# Patient Record
Sex: Female | Born: 1935 | Race: White | Hispanic: No | Smoking: Never smoker
Health system: Southern US, Community
[De-identification: ages and names within clinical notes are randomized; demographics above are authoritative.]

## PROBLEM LIST (undated history)

## (undated) DIAGNOSIS — R0602 Shortness of breath: Secondary | ICD-10-CM

## (undated) DIAGNOSIS — R002 Palpitations: Secondary | ICD-10-CM

## (undated) DIAGNOSIS — F09 Unspecified mental disorder due to known physiological condition: Secondary | ICD-10-CM

## (undated) DIAGNOSIS — E7800 Pure hypercholesterolemia, unspecified: Secondary | ICD-10-CM

## (undated) DIAGNOSIS — I509 Heart failure, unspecified: Secondary | ICD-10-CM

## (undated) DIAGNOSIS — C50919 Malignant neoplasm of unspecified site of unspecified female breast: Secondary | ICD-10-CM

## (undated) DIAGNOSIS — I350 Nonrheumatic aortic (valve) stenosis: Secondary | ICD-10-CM

## (undated) DIAGNOSIS — E039 Hypothyroidism, unspecified: Secondary | ICD-10-CM

## (undated) DIAGNOSIS — I10 Essential (primary) hypertension: Secondary | ICD-10-CM

## (undated) HISTORY — DX: Malignant neoplasm of unspecified site of unspecified female breast: C50.919

## (undated) HISTORY — DX: Hypothyroidism, unspecified: E03.9

## (undated) HISTORY — PX: OTHER SURGICAL HISTORY: SHX169

## (undated) HISTORY — DX: Unspecified mental disorder due to known physiological condition: F09

## (undated) HISTORY — PX: APPENDECTOMY: SHX54

## (undated) HISTORY — DX: Palpitations: R00.2

## (undated) HISTORY — DX: Shortness of breath: R06.02

## (undated) HISTORY — DX: Nonrheumatic aortic (valve) stenosis: I35.0

## (undated) HISTORY — DX: Pure hypercholesterolemia, unspecified: E78.00

## (undated) HISTORY — DX: Essential (primary) hypertension: I10

## (undated) HISTORY — DX: Heart failure, unspecified: I50.9

---

## 1998-10-12 ENCOUNTER — Ambulatory Visit (HOSPITAL_COMMUNITY): Admission: RE | Admit: 1998-10-12 | Discharge: 1998-10-12 | Payer: Self-pay | Admitting: Cardiology

## 1999-07-04 HISTORY — PX: OTHER SURGICAL HISTORY: SHX169

## 1999-11-15 ENCOUNTER — Encounter: Payer: Self-pay | Admitting: Cardiovascular Disease

## 1999-11-15 ENCOUNTER — Observation Stay (HOSPITAL_COMMUNITY): Admission: RE | Admit: 1999-11-15 | Discharge: 1999-11-17 | Payer: Self-pay | Admitting: Cardiovascular Disease

## 2007-07-04 HISTORY — PX: CARDIAC CATHETERIZATION: SHX172

## 2008-03-26 ENCOUNTER — Ambulatory Visit: Payer: Self-pay | Admitting: Cardiology

## 2008-03-27 ENCOUNTER — Inpatient Hospital Stay (HOSPITAL_COMMUNITY): Admission: EM | Admit: 2008-03-27 | Discharge: 2008-04-01 | Payer: Self-pay | Admitting: Cardiovascular Disease

## 2008-06-09 ENCOUNTER — Ambulatory Visit: Admission: RE | Admit: 2008-06-09 | Discharge: 2008-07-23 | Payer: Self-pay | Admitting: Radiation Oncology

## 2009-11-27 IMAGING — CT CT ANGIO CHEST
2 of 7 series · 18 of 36 positions shown · IV contrast (APPLIED)
Comparison: Report from chest radiograph of 5440.

CLINICAL DATA: Unstable angina.  Coronary artery disease.  Elevated
D-dimer.

CT ANGIOGRAPHY CHEST
TECHNIQUE: Multidetector CT imaging of the chest using the
standard protocol during bolus administration of intravenous
contrast. Multiplanar reconstructed images obtained and reviewed to
evaluate the vascular anatomy.
Contrast: 80 ml [id number]-R77

[Series 8: pulm embolism 1.0 b25f thins · axial · 0.60mm/px · z∈[-252,-28]mm · 17 of 250 slices shown]
[im 13/250  lung]
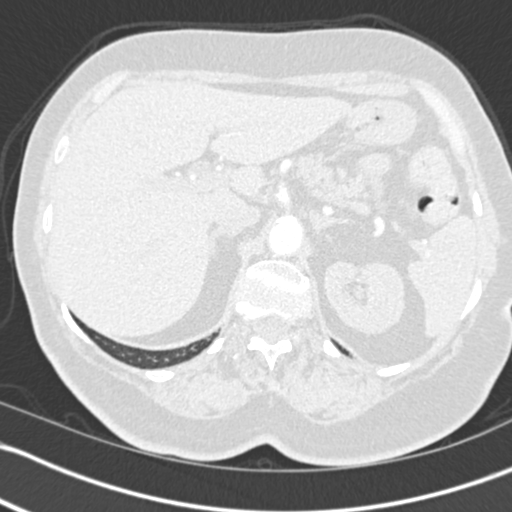
[im 25/250  mediastinal]
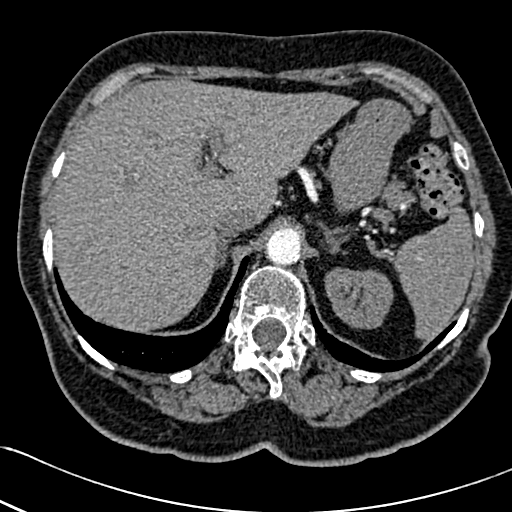
[im 38/250  lung]
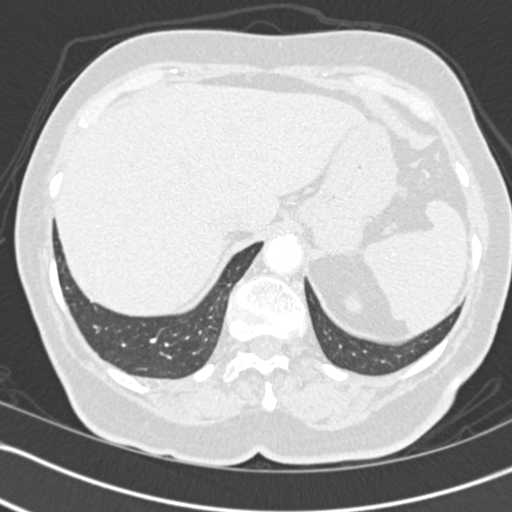
[im 50/250  mediastinal]
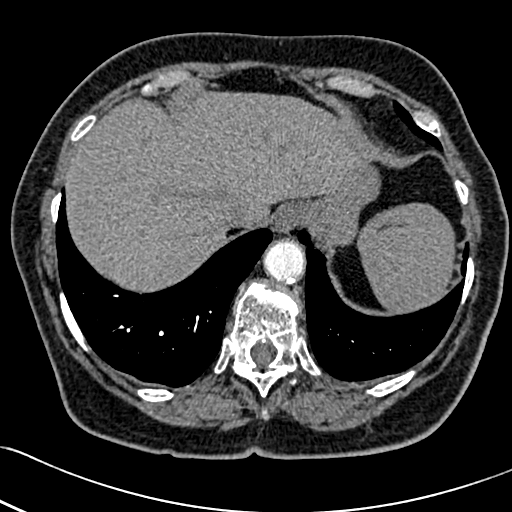
[im 75/250  lung]
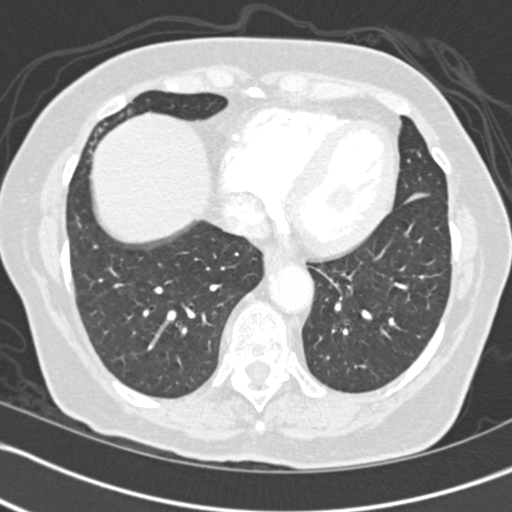
[im 88/250  mediastinal]
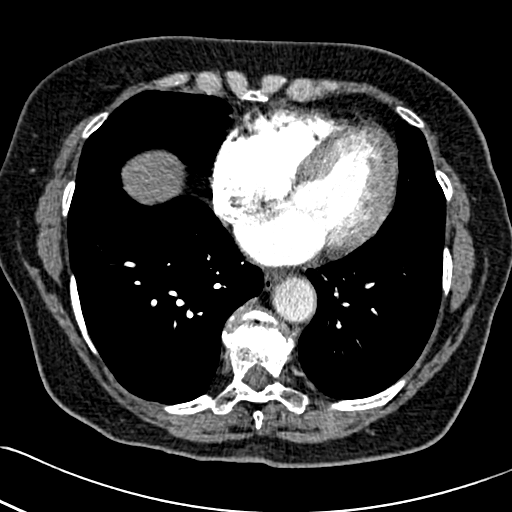
[im 100/250  lung]
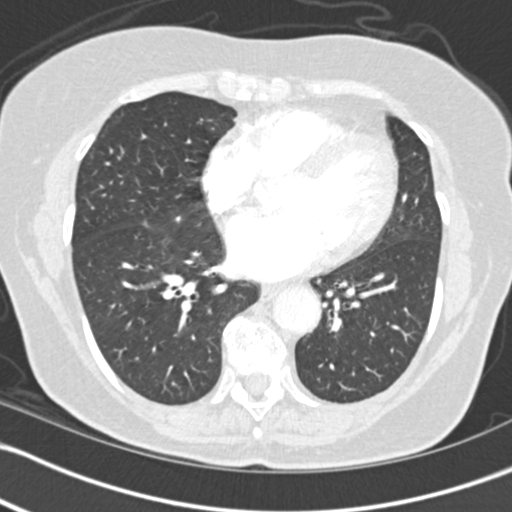
[im 113/250  mediastinal]
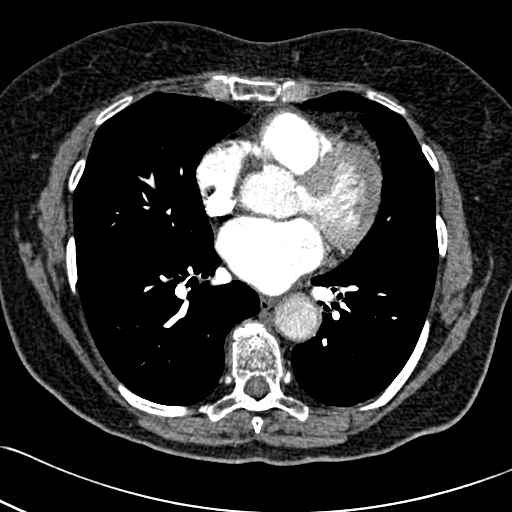
[im 125/250  lung]
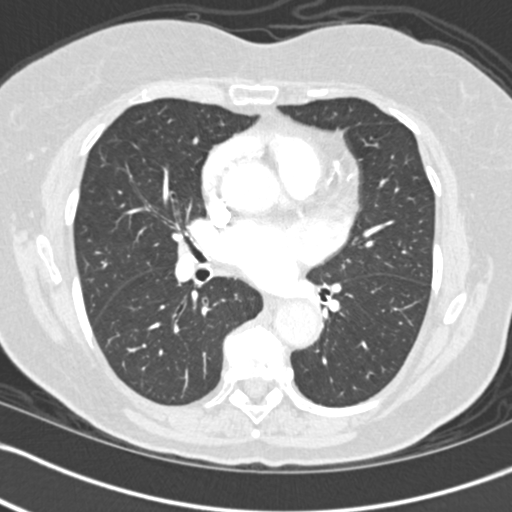
[im 137/250  mediastinal]
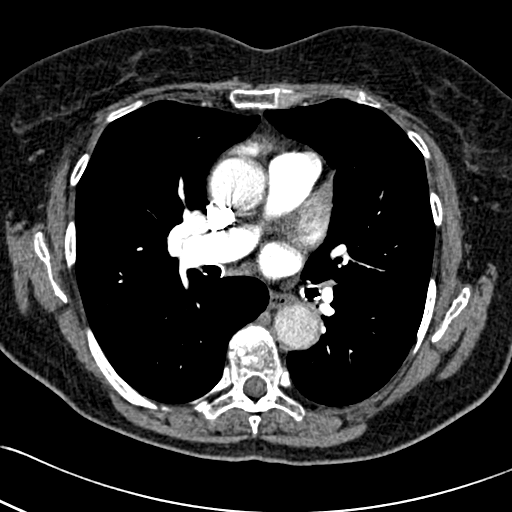
[im 150/250  lung]
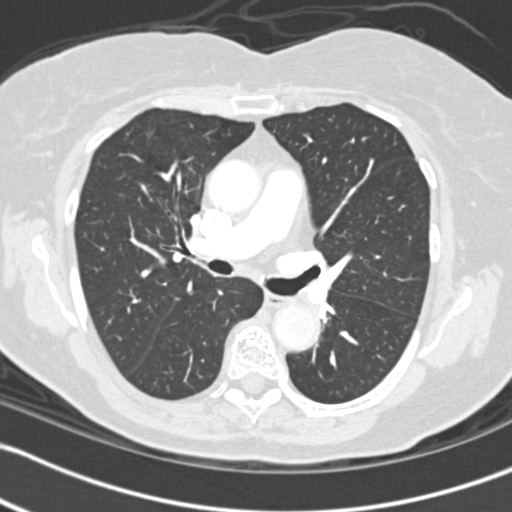
[im 162/250  mediastinal]
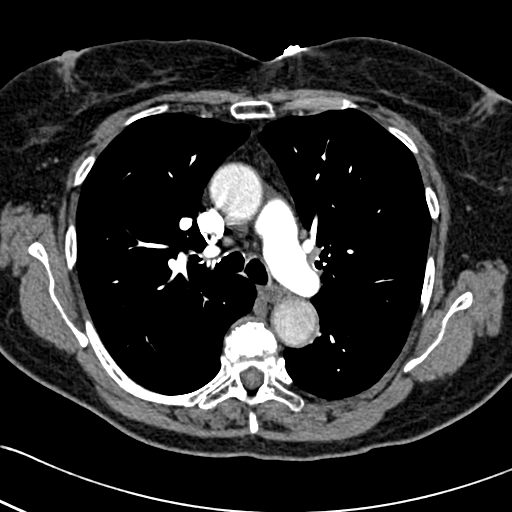
[im 175/250  lung]
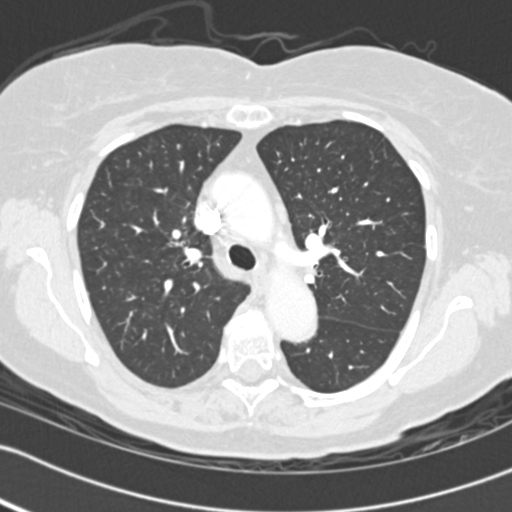
[im 200/250  mediastinal]
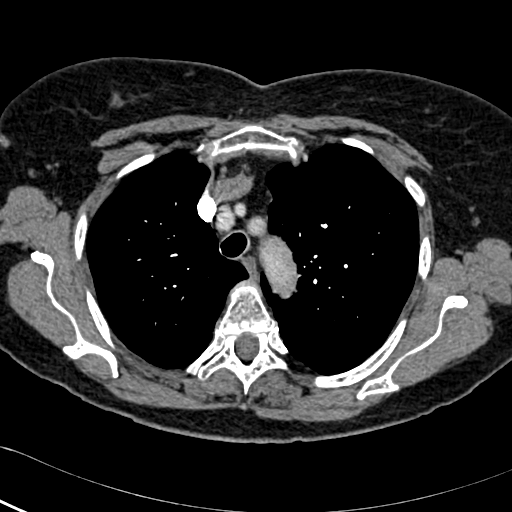
[im 212/250  lung]
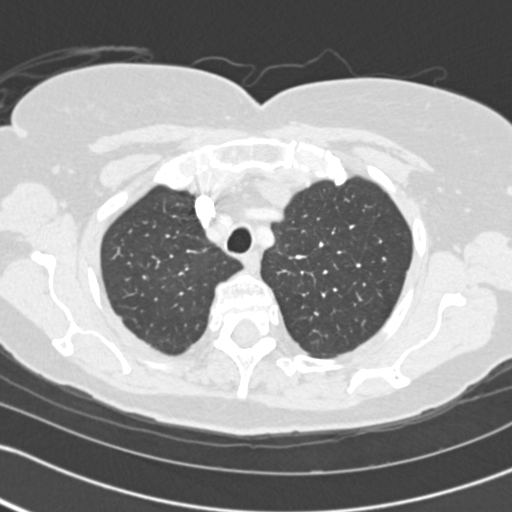
[im 225/250  mediastinal]
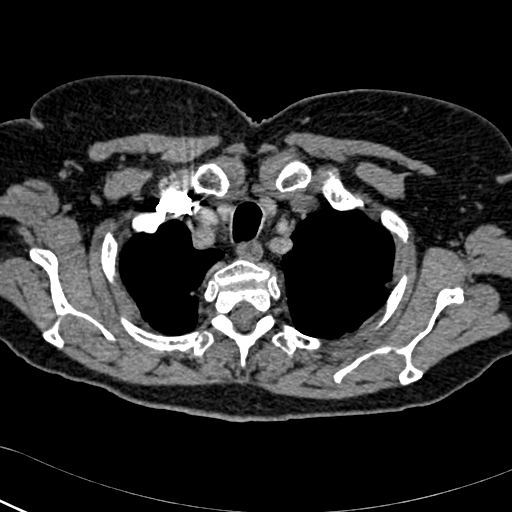
[im 237/250  lung]
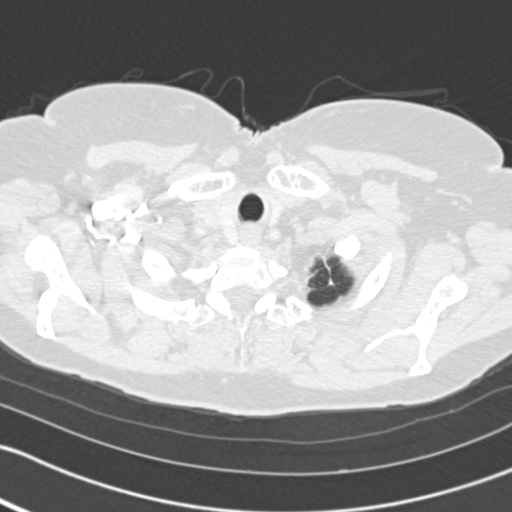

[Series 602: cor chest · coronal · 0.60mm/px · 1 of 106 slices shown]
[im 53/106  mediastinal]
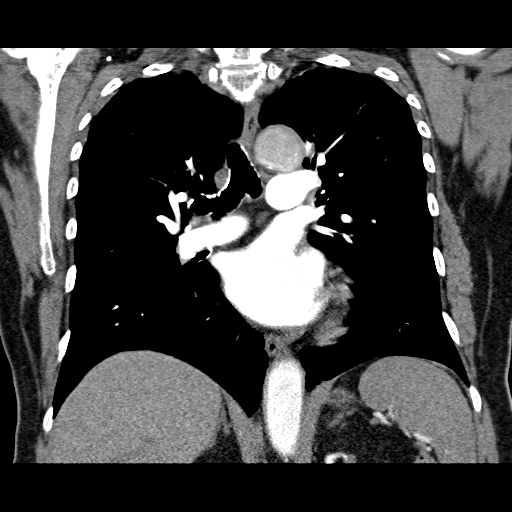

[18 of 36 positions shown; findings below may reference images not displayed]

FINDINGS: There is an asymmetric soft tissue density in the upper
inner quadrant of the right breast and measures approximately 1.6 x
1.2 cm.

This is a technically good study for evaluation of the pulmonary
arteries.  No filling defects are identified within the pulmonary
arterial tree.  The thoracic aorta is normal in caliber and
contains scattered atherosclerotic calcification.  No evidence of
aortic dissection.  There is mild cardiomegaly with left atrial
prominence.  Left ventricular wall hypertrophy is noted.

There is no lymphadenopathy in the mediastinum, hila, or axilla.

Negative for pleural or pericardial effusion. There are
atherosclerotic calcifications of the coronary arteries.

The trachea and mainstem bronchi are patent.  Minimal pleural
parenchymal scarring at the lung apices bilaterally.  Linear
atelectasis or scar in the lingula.  Negative for airspace
opacities or masses.

There are extensive degenerative changes of the thoracic spine,
with endplate sclerosis and disc space narrowing, involving the mid
to lower thoracic spine vertebral bodies.  No acute osseous
abnormality.

Limited imaging of the upper abdomen shows no acute abnormality.
IMPRESSION: 1.  1.6 cm soft tissue density in the upper inner of the right
breast.  Suggest further evaluation with diagnostic mammogram. This
finding was called to the patient's nurse, Abdhula on 03/28/2008
[DATE].
2.  No evidence of pulmonary embolism.
3.   Mild cardiomegaly with left ventricular hypertrophy and
coronary artery disease.
4.  Extensive degenerative changes of the mid to lower thoracic
spine.

## 2010-07-03 HISTORY — PX: OTHER SURGICAL HISTORY: SHX169

## 2010-11-01 ENCOUNTER — Ambulatory Visit (INDEPENDENT_AMBULATORY_CARE_PROVIDER_SITE_OTHER): Payer: Medicare Other | Admitting: Internal Medicine

## 2010-11-01 DIAGNOSIS — Z7982 Long term (current) use of aspirin: Secondary | ICD-10-CM

## 2010-11-01 DIAGNOSIS — R1310 Dysphagia, unspecified: Secondary | ICD-10-CM

## 2010-11-01 DIAGNOSIS — R0602 Shortness of breath: Secondary | ICD-10-CM

## 2010-11-01 DIAGNOSIS — K219 Gastro-esophageal reflux disease without esophagitis: Secondary | ICD-10-CM

## 2010-11-11 DIAGNOSIS — R0602 Shortness of breath: Secondary | ICD-10-CM

## 2010-11-14 ENCOUNTER — Encounter: Payer: Self-pay | Admitting: Cardiovascular Disease

## 2010-11-15 NOTE — Cardiovascular Report (Signed)
NAME:  [name] [name], [name] [name]                  ACCOUNT NO.:  [id number]   MEDICAL RECORD NO.:  [id number]          PATIENT TYPE:  INP   LOCATION:  3709                         FACILITY:  [facility]   PHYSICIAN:  Antonieta Iba, MD   DATE OF BIRTH:  [birth year]   DATE OF PROCEDURE:  DATE OF DISCHARGE:                            CARDIAC CATHETERIZATION   This is a cardiac catheterization report.   PHYSICIAN PERFORMING THE PROCEDURE:  Antonieta Iba, MD, [facility].   INDICATIONS FOR PROCEDURE:  Known peripheral vascular disease, stent in  the renal artery, diabetes, hypertension, hyperlipidemia who presents  with chest pain.   PROCEDURE IN DETAIL:  The details of the procedure were explained to the  patient and consent was obtained.  The patient was brought to the  cardiac catheterization lab and prepped and draped in the usual sterile  fashion.  The modified Seldinger technique was applied and access was  gained in the right femoral artery.  A Judkins left and right #4  catheter were used to engage the left main and RCA respectively.  Hand  injection of contrast was used to detail the coronary anatomy with  angiography.  A 5-French pigtail catheter was used to visualize the LV  function.  Engagement of the RCA was challenging and a no torque  catheter was eventually used for visualization.  No complications were  reported.  The catheterization was removed and hemostasis obtained with  manual pressure.   CORONARY ANATOMY/FINDING:  The left main is a moderate-to-large sized  vessel that bifurcates into the LAD and left circumflex.  There was no  significant disease noted.   Left anterior descending:  The LAD is a moderate-to-large sized vessel  that extends around the apex.  There are 2 diagonal vessels.  D1 is a  moderate-size vessel and D2 is a small-to-moderate sized vessel.  There  are diffuse regions of mild-to-moderate disease, most notably at the  proximal  LAD before the takeoff of D1 as well as the ostial D1 region  that is approximately 30-40%.  There is a stretch of 20% long diffuse  disease between D1 and D2 of the LAD.  There is also 30% disease prior  to the takeoff of D2 as well as the ostial D2 region.  There is 30-40%  distal disease in the mid-to-distal LAD.   The left circumflex:  The left circumflex is a moderate-to-large size  vessel that has one moderate-to-large size obtuse marginal branch.  There is 30% disease after the takeoff of the OM1 in the true circ as  well as 30% disease in the proximal OM1.  Otherwise, there is no  significant disease noted.   The right coronary artery is a dominant vessel with a moderate-sized PDA  and PL branch distally.  There is mild 20-30% lesions x2 in the proximal  mid regions of the RCA.   On LV gram, ejection fraction appears grossly normal with ejection  fraction estimated at greater than 55%.  There is mild mitral  regurgitation noted.  Otherwise, no significant abnormalities.  In summary, right is dominant coronary system with mild diffuse coronary  artery disease of the LAD, left circumflex, and RCA.  Medical management  is recommended.  Etiology of chest pain is uncertain given only mild  diffuse disease, likely not secondary to ischemia.  She will be referred  back to Dr. Alanda [name] for further medical management.      Antonieta Iba, MD  Electronically Signed     TJG/MEDQ  D:  03/30/2008  T:  03/31/2008  Job:  [id number]   cc:   Gerlene Burdock A. [name] [name], M.D.  Lia Hopping
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
## 2010-11-15 NOTE — Consult Note (Signed)
NAME:  [name], [name]                  ACCOUNT NO.:  [id number]  MEDICAL RECORD NO.:  [id number]          PATIENT TYPE:  LOCATION:                                 FACILITY:  PHYSICIAN:  Lionel December, M.D.    DATE OF BIRTH:  April 19, 1927  DATE OF CONSULTATION:11/02/2010 DATE OF DISCHARGE:                                CONSULTATION   REASON FOR CONSULTATION:  GERD and dysphagia.  HISTORY OF PRESENT ILLNESS:  [name] [name] is an 77 year old female, referred to our office by Dr. Alanda Amass for acid reflux.  [name] [name] has also complained of dysphagia for 6-7 months.  She says her mouth is sore. She says her mouth burns all the time.  She also states that she can feel acid bubbling up in her esophagus.  She does complain of some shortness of breath.  Her last EGD was in February 2010, which revealed a normal EGD.  Bulbar duodenum mucosa appeared to be normal without any changes to suggest celiac disease.  She underwent the EGD for recent onset of iron deficiency anemia.  Her appetite is okay since starting the Megace.  She has had no recent weight loss.  She usually has a bowel movement a day.  She denies any rectal bleeding.  She does complain of some shortness of breath.  HOME MEDICATIONS: 1. Aspirin 81 mg a day. 2. Paroxetine 40 mg a day. 3. Glimepiride 4 mg a day. 4. BuSpar 15 mg a day. 5. Nexium 40 mg a day. 6. Triamterene/HCTZ 1 a day. 7. Diovan 160 mg a day. 8. Benazepril 10 mg a day. 9. Nabumetone 500 mg a day. 10.Vitamin D2 1.25 mg once a week. 11.Levothyroxine 75 mcg daily. 12.TriCor 145 mg daily. 13.Megace twice a day. 14.Fluticasone ointment as needed. 15.Lipitor 40 mg a day. 16.Enablex 15 mg daily. 17.Xanax 0.5 as needed. 18.Fish oil 2 a day.  PAST SURGICAL HISTORY:  She has had a tonsillectomy in the past, appendectomy, right breast carcinoma lumpectomy, and she has had a repair of anal fissure.  MEDICAL HISTORY:  Includes CAD, right renal stent, bilateral  corneal implants.  She has been a diabetic for 2 years.  Hypertension and high cholesterol.  She had an EGD with ED on August 22, 2002, with recurrent solid food dysphagia.  She had an esophagus narrowing in the cervical esophagus, dilated by passing a 60-French Maloney dilator.  No web is obvious but she has a history of it.  On August 01, 2001, she underwent an EGD which revealed an esophageal web in the cervical esophagus which was completely disrupted by passing a 6-French Maloney dilator.  On March 28, 2001, she underwent an EGD ED which revealed a distinct esophageal web in the cervical esophagus which was disrupted by passing a 56-French Maloney dilator, it was more obvious on the barium study than on the EGD.  She had a small sliding hiatal hernia and erosive gastritis.  She is allergic to AMPICILLIN, BENADRYL, and SULFA.  OBJECTIVE:  VITAL SIGNS:  Her weight is 170, height 5 feet and 5-1/2 inches, temperature 99, blood pressure 150/94, pulse 72. HEENT:  She has upper and lower dentures.  Her oral mucosa is moist.  I did not see any lesions in her mouth.  Her conjunctivae pink.  Sclerae anicteric. NECK:  Thyroid is normal.  There is no cervical lymphadenopathy. LUNGS:  Clear. HEART:  Regular rate and rhythm. ABDOMEN:  Soft.  Bowel sounds are positive.  No masses.  There is no edema to her extremities.  ASSESSMENT:  [name] [name] is an 77 year old female with complaints of gastroesophageal reflux disease and dysphagia.  An esophageal web needs to be ruled out given her past history of an esophageal web.  She also could have some nocturnal aspiration causing her shortness of breath.  RECOMMENDATIONS:  We will plan on EGD/ED. She will take her Nexium 30 minutes before supper.  After supper, she is to stay up for 2-3 hours and do not snack after eating.    ______________________________ Dorene Ar, NP   ______________________________ Lionel December,  M.D.    TS/MEDQ  D:  11/02/2010  T:  11/02/2010  Job:  [id number]  cc:   Gerlene Burdock A. Alanda Amass, M.D. Fax: [phone]  Electronically Signed by Dorene Ar PA on 11/07/2010 09:12:17 AM Electronically Signed by Lionel December M.D. on 11/15/2010 05:32:43 PM

## 2010-11-18 NOTE — Procedures (Signed)
[facility]  Patient:    [name] [name], [name] [name]                           MRN: [id number] Adm. Date:  [id number] Attending:  Ruta Hinds CC:         [facility]             Richard A. Alanda Amass, M.D.             [facility]             Runell Gess, M.D.             Fara Chute, M.D., [facility]                           Procedure Report  PROCEDURE:  Retrograde abdominal aortic catheterization, abdominal aortic angiogram, mid stream PA projection, selective right renal angiography, transtenotic gradient right renal artery, PTA right renal artery stenosis, subsequent stent for suboptimal result and ostial stenosis.  OPERATING PHYSICIANS:  Richard A. Alanda Amass, M.D. and Runell Gess, M.D.  COMPLICATIONS:  None.  MEDICATIONS:  Heparin 3000 IV, Versed 2 mg IV, 1 percent xylocaine, Visipaque dye.  Diagnostic procedure was done through a 6 [place] short cordis side-arm sheath that was placed in the RFA with a single anterior puncture using an 18 thin walled needle. A Wholey wire was used to traverse the iliac system and guidewire exchange was used throughout the procedure. Abdominal aortic angiogram was done through a 5 [place] cordis tennis racquet catheter above the level of the renal arteries at 30 cc, 20 cc per second. Another second injection above the iliac bifurcation was done at 20 cc, 20 cc per second with visualization to the SFA profunda junctions bilaterally. Selective right renal angiogram was done by hand injection with a 5 French short IMA catheter. Transtenotic gradient was measured. The patient tolerated the diagnostic procedure well. Arterial pressures were monitored throughout the procedure and ranged approximately 210-225 mmHg systolic. At the end of the procedure, arterial pressure was 180-85 systolic.  There was a 35 mmHg transtenotic gradient across the right renal artery stenotic lesion.  Abdominal aortic  angiogram in mid stream PA projection demonstrated patent celiac and SMA access proximally. The left renal artery was single and had some minor 10% irregularity in the proximal third with no significant stenosis.  The right renal artery was single and had 85% proximal stenosis with narrowing extending to the right renal ostia. There was mild post stenotic dilatation beyond this.  The infrarenal abdominal aorta demonstrated an eccentric ulcer on the right side. The hypogastric was intact.  The common and external iliac arteries were mildly tortuous but widely patent and smooth bilaterally with no stenosis. The hypogastrics were intact bilaterally.  The SFA profunda junctions were normal bilaterally, and there was normal SFA visualized to the proximal third of the thigh. There appeared to be good runoff bilaterally.  Selective right renal angiography revealed 85% stenosis of the proximal right renal artery. Transtenotic gradient was approximately 35 mmHg with the 6 French catheter.  The catheter and sheath were exchanged for an 8 [place] system and the right renal artery was intubated with an IMA short 8 [place] guide and high-grade stenosis was crossed with 0.035 inch Wholey wire. The lesion was predilated with a 4 mm x 1.5 cm cordis "Powerflex" balloon at 8 atmospheres for 30 seconds.  Hand injection showed a suboptimal angioplasty result with elastic recoil and residual stenosis. It was elected to proceed with stenting particularly since this involved the right renal artery not ostia. A [id number] cordis balloon expandable stent was hand crimped onto a 5 mm x 1.5 mm cordis Powerflex balloon. The stent was positioned across the proximal stenosis with using the guiding catheter as a covering sheath. The guiding catheter was pulled back, the stent precisely positioned at the ostia and deployed at 14 atmospheres for 30 seconds. There was some minimal narrowing following this so the balloon  was upgraded and using exchange technique, a 6 mm x 1.5 cm cordis Powerflex balloon was advanced into the stent and the stent post dilated to 10 atmospheres for 40 seconds. The balloon was pulled back and final injection showed excellent angiographic result with full balloon stent expansion and apposition. The stent covered the ostia and the lesion was fully covered. There was no dissection and there was 0% residual narrowing and no residual transtenotic gradient. Dilatation system was removed. The patient had been given 3000 units of heparin at the beginning of the interventional procedure. She was transferred to the holding area awaiting normalization of ACT and sheath removal from the right groin with pressure hemostasis. The patient tolerated the procedure well.  [name] [name] history is well outlined in her H&P. She is widowed mother of 5 with 12 grandchildren and 5 great grandchildren. She is a nonsmoker and has a history of neurocardiogenic syncope with prior positive tilt table test. she has been treated with SSRI and beta blockers and has not had a recurrent syncope. She does have a history of nonsustained palpitations and a negative cardiolite April 06, 1999 for ischemia with a positive family history of coronary disease. Her blood pressure has been progressively increasing over the last 6 months and serial renal duplexes since Nov 24, 1998 have showed progressive increase to greater than 300 cm per second velocity with a renal aortic ratio of 3.7. Selected to proceed with diagnostic study and subsequent intervention for indications of progressive hypertension on multiple medication and positive noninvasive duplex study.  The patients had successful renal artery dilatation. Would plan to continue medical therapy with Doppler surveillance as an outpatient. Hopefully her blood pressure will be much easier to control. The creatinine was 0.9 at the  beginning of the procedure and the  patient was hydrated preoperatively.  CATHETERIZATION DIAGNOSIS: 1. Systemic hypertension accelerated severe on multiple medications. 2. High-grade right renal artery stenosis treated with successful PTA and    subsequent stent for inadequate PTA result. Ostial and proximal RRA    stenosis. 3. Neurocardiogenic syncope past prior tilt table test. Successfully treated    with SSRI and beta blockers. 4. GERD. 5. Chronic right lower quadrant discomfort etiology unknown. Recent negative    uterine biopsy. 6. Hyperlipidemia. 7. Anxiety. 8. Strong family history of coronary disease. 9. Nonsmoker. DD:  11/15/99 TD:  11/17/99 Job: [id number]

## 2010-11-29 ENCOUNTER — Ambulatory Visit (INDEPENDENT_AMBULATORY_CARE_PROVIDER_SITE_OTHER): Payer: Self-pay | Admitting: Internal Medicine

## 2010-12-14 ENCOUNTER — Ambulatory Visit (HOSPITAL_COMMUNITY)
Admission: RE | Admit: 2010-12-14 | Discharge: 2010-12-14 | Disposition: A | Discharge status: Home / Self Care | Payer: Medicare Other | Source: Ambulatory Visit | Attending: Internal Medicine | Admitting: Internal Medicine

## 2010-12-14 ENCOUNTER — Encounter (HOSPITAL_BASED_OUTPATIENT_CLINIC_OR_DEPARTMENT_OTHER): Payer: Medicare Other | Admitting: Internal Medicine

## 2010-12-14 DIAGNOSIS — R0602 Shortness of breath: Secondary | ICD-10-CM

## 2010-12-14 DIAGNOSIS — E119 Type 2 diabetes mellitus without complications: Secondary | ICD-10-CM | POA: Insufficient documentation

## 2010-12-14 DIAGNOSIS — Z79899 Other long term (current) drug therapy: Secondary | ICD-10-CM | POA: Insufficient documentation

## 2010-12-14 DIAGNOSIS — Z7982 Long term (current) use of aspirin: Secondary | ICD-10-CM | POA: Insufficient documentation

## 2010-12-14 DIAGNOSIS — R1310 Dysphagia, unspecified: Secondary | ICD-10-CM

## 2010-12-14 DIAGNOSIS — D131 Benign neoplasm of stomach: Secondary | ICD-10-CM | POA: Insufficient documentation

## 2010-12-14 DIAGNOSIS — K219 Gastro-esophageal reflux disease without esophagitis: Secondary | ICD-10-CM

## 2010-12-14 DIAGNOSIS — I10 Essential (primary) hypertension: Secondary | ICD-10-CM | POA: Insufficient documentation

## 2010-12-14 DIAGNOSIS — E785 Hyperlipidemia, unspecified: Secondary | ICD-10-CM | POA: Insufficient documentation

## 2010-12-14 LAB — GLUCOSE, CAPILLARY: Glucose-Capillary: 85 mg/dL (ref 70–99)

## 2011-01-02 NOTE — Op Note (Signed)
  NAME:  Stroder, Leathia                  ACCOUNT NO.:  [id number]  MEDICAL RECORD NO.:  [id number]  LOCATION:  DAYP                          FACILITY:  [facility]  PHYSICIAN:  Lionel December, M.D.    DATE OF BIRTH:  05-07-1927  DATE OF PROCEDURE:  12/14/2010 DATE OF DISCHARGE:                              OPERATIVE REPORT   PROCEDURE:  Esophagogastroduodenoscopy with esophageal dilation.  INDICATION:  Ernesteen is an 77 year old Caucasian female with chronic GERD, maintain on antireflux measures and PPI, who presents with dysphagia to solids which she has experienced intermittently for more than 6 months. She has had her esophagus dilated in the past, most recently in February 2010 at [facility] in [place].  Procedure risks were reviewed with the patient. Informed consent was obtained.  MEDICATIONS FOR CONSCIOUS SEDATION:  Cetacaine spray for pharyngeal topical anesthesia, Demerol 50 mg IV, and Versed 5 mg IV.  FINDINGS:  Procedure performed in endoscopy suite.  The patient's vital signs and O2 sat were monitored during the procedure and remained stable.  The patient was placed in left lateral recumbent position and Pentax videoscope was passed through oropharynx without any difficulty into esophagus.  Esophagus, mucosa of the esophagus was normal.  GE junction was located 36 cm and was unremarkable without ring or stricture formation.  No hernia was apparent and the patient did not heave or cough to bring it out.  Stomach was empty and distended very well insufflation.  Folds of proximal stomach are normal.  Examination of the mucosa revealed few small hyperplastic-appearing polyps at gastric body and one close to cardia best seen on retroflex view.  These were left alone.  There were no erosions or ulcers.  Pyloric channel was patent.  Angularis was also examined and was normal.  Duodenum, bulbar mucosa was normal.  Scope was passed into second part of the duodenum where mucosa and folds were also  normal.  Endoscope was withdrawn.  Esophagus was dilated by passing 56-French Maloney dilator to full insertion.  As the dilators withdrawn, endoscope was passed again. There was a tiny linear mucosal disruption with another linear area of ecchymosis at proximal esophagus just below UAS was felt to be disrupted web.  Endoscope was withdrawn.  The patient tolerated the procedure well.  FINAL DIAGNOSES: 1. Few small hyperplastic-appearing polyps at gastric body which were     left alone. 2. No evidence of peptic ulcer disease. Schatzki ring, or stricture. 3. Esophageal dilation with 56-French Maloney dilator resulted in very     small linear disruption at proximal esophagus below UAS possibly     indicative of web.  RECOMMENDATIONS: 1. Antireflux measures reinforced.  She will resume her usual meds     including aspirin. 2. The patient is advised to call our office with a progress report in     1 week.          ______________________________ Lionel December, M.D.     NR/MEDQ  D:  12/14/2010  T:  12/15/2010  Job:  [id number] cc:   Lia Hopping, MD Fax: [phone]  Electronically Signed by Lionel December M.D. on 01/02/2011 12:38:31 AM

## 2011-04-03 LAB — CBC
HCT: 32.4 — ABNORMAL LOW
Hemoglobin: 10.7 — ABNORMAL LOW
Hemoglobin: 10.9 — ABNORMAL LOW
MCHC: 32.1
MCHC: 32.9
MCHC: 33.1
MCHC: 33.8
MCV: 83.1
MCV: 83.9
MCV: 84.9
Platelets: 114 — ABNORMAL LOW
Platelets: 122 — ABNORMAL LOW
Platelets: 164
RBC: 3.67 — ABNORMAL LOW
RBC: 3.86 — ABNORMAL LOW
RBC: 3.91
RBC: 4.36
WBC: 4.9

## 2011-04-03 LAB — GLUCOSE, CAPILLARY
Glucose-Capillary: 111 — ABNORMAL HIGH
Glucose-Capillary: 71
Glucose-Capillary: 77
Glucose-Capillary: 87
Glucose-Capillary: 88
Glucose-Capillary: 95
Glucose-Capillary: 98

## 2011-04-03 LAB — BASIC METABOLIC PANEL
BUN: 13
BUN: 17
BUN: 19
CO2: 29
CO2: 30
CO2: 30
Calcium: 9.1
Calcium: 9.3
Chloride: 101
Chloride: 103
Chloride: 98
Creatinine, Ser: 1.04
Creatinine, Ser: 1.1
Creatinine, Ser: 1.14
GFR calc Af Amer: 58 — ABNORMAL LOW
GFR calc Af Amer: 58 — ABNORMAL LOW
GFR calc Af Amer: >60
GFR calc non Af Amer: 48 — ABNORMAL LOW
Glucose, Bld: 82
Potassium: 3.9
Potassium: 3.9
Sodium: 139

## 2011-04-03 LAB — TSH: TSH: 2.927

## 2011-04-03 LAB — DIFFERENTIAL
Basophils Relative: 0
Eosinophils Relative: 4
Lymphocytes Relative: 40
Lymphs Abs: 1.9
Monocytes Absolute: 0.5
Monocytes Relative: 10
Neutro Abs: 2.5
Neutrophils Relative %: 47

## 2011-04-03 LAB — LIPID PANEL
HDL: 43
LDL Cholesterol: 101 — ABNORMAL HIGH
Total CHOL/HDL Ratio: 4
Triglycerides: 140
VLDL: 28

## 2011-04-03 LAB — T3, FREE: T3, Free: 2 — ABNORMAL LOW (ref 2.3–4.2)

## 2011-04-03 LAB — H. PYLORI ANTIBODY, IGG: H Pylori IgG: <0.4

## 2011-04-03 LAB — CK TOTAL AND CKMB (NOT AT ARMC)
CK, MB: 1.9
CK, MB: 2.2
Total CK: 125
Total CK: 139

## 2011-04-03 LAB — T4, FREE: Free T4: 1.21

## 2011-04-03 LAB — D-DIMER, QUANTITATIVE: D-Dimer, Quant: 0.95 — ABNORMAL HIGH

## 2011-04-03 LAB — TROPONIN I: Troponin I: 0.02

## 2011-04-03 LAB — HEMOGLOBIN A1C: Mean Plasma Glucose: 134

## 2011-07-04 DIAGNOSIS — R002 Palpitations: Secondary | ICD-10-CM

## 2011-07-04 HISTORY — DX: Palpitations: R00.2

## 2011-10-26 ENCOUNTER — Ambulatory Visit (INDEPENDENT_AMBULATORY_CARE_PROVIDER_SITE_OTHER): Payer: Medicare Other | Admitting: Internal Medicine

## 2011-10-26 ENCOUNTER — Encounter (INDEPENDENT_AMBULATORY_CARE_PROVIDER_SITE_OTHER): Payer: Self-pay | Admitting: Internal Medicine

## 2011-10-26 VITALS — BP 136/68 | HR 88 | Temp 98.8°F | Ht 65.0 in | Wt 145.9 lb

## 2011-10-26 DIAGNOSIS — I10 Essential (primary) hypertension: Secondary | ICD-10-CM | POA: Insufficient documentation

## 2011-10-26 DIAGNOSIS — E7800 Pure hypercholesterolemia, unspecified: Secondary | ICD-10-CM | POA: Insufficient documentation

## 2011-10-26 DIAGNOSIS — M549 Dorsalgia, unspecified: Secondary | ICD-10-CM | POA: Insufficient documentation

## 2011-10-26 DIAGNOSIS — E039 Hypothyroidism, unspecified: Secondary | ICD-10-CM | POA: Insufficient documentation

## 2011-10-26 DIAGNOSIS — C50919 Malignant neoplasm of unspecified site of unspecified female breast: Secondary | ICD-10-CM | POA: Insufficient documentation

## 2011-10-26 DIAGNOSIS — E119 Type 2 diabetes mellitus without complications: Secondary | ICD-10-CM | POA: Insufficient documentation

## 2011-10-26 DIAGNOSIS — R112 Nausea with vomiting, unspecified: Secondary | ICD-10-CM

## 2011-10-26 NOTE — Patient Instructions (Signed)
1/2 Phenergan in am and pm. Take you pain medication as prescribed. PR in 2 weeks.

## 2011-10-26 NOTE — Progress Notes (Signed)
Subjective:     Patient ID: [name] [name], female   DOB: 1927/06/20, 78 y.o.   MRN: [id number]  HPI  She fell 7 weeks ago and apparently crushed a bone in her lower back. She started to have nausea.  She was seen at [facility] and actually admitted for 5 days. She was to be seen by a GI physican for possible EGD at [facility] but the family refused. She has nausea every day with her bva. She also has vomiting. Her sugar has been dropping.   She underwent a CT which was normal.  She eats three meals a day.  She has lower back pain.  She occasionally has lower abdominal tenderness. She usually has a BM about every other day. No melena or bright red rectal bleeding. No dysphagia 12/2010 EGD/ED: FINAL DIAGNOSES:  1. Few small hyperplastic-appearing polyps at gastric body which were  left alone.  2. No evidence of peptic ulcer disease. Schatzki ring, or stricture.  3. Esophageal dilation with 56-French Maloney dilator resulted in very  small linear disruption at proximal esophagus below UAS possibly  indicative of web.   Review of Systems Current Outpatient Prescriptions  Medication Sig Dispense Refill  . alendronate (FOSAMAX) 70 MG tablet Take 70 mg by mouth every 7 (seven) days. Take with a full glass of water on an empty stomach.      . ALPRAZolam (XANAX) 0.5 MG tablet Take 0.5 mg by mouth at bedtime as needed.      Marland Kitchen aspirin 81 MG tablet Take 81 mg by mouth daily.      Marland Kitchen atorvastatin (LIPITOR) 40 MG tablet Take 40 mg by mouth daily.      . busPIRone (BUSPAR) 15 MG tablet Take 15 mg by mouth at bedtime.      . calcitonin, salmon, (MIACALCIN/FORTICAL) 200 UNIT/ACT nasal spray Place 1 spray into the nose daily.      Jennette Banker Sodium 30-100 MG CAPS Take by mouth.      . Cholecalciferol (VITAMIN D3) 3000 UNITS TABS Take by mouth. 1.25 weekly      . donepezil (ARICEPT) 10 MG tablet Take 10 mg by mouth at bedtime as needed.      Marland Kitchen esomeprazole (NEXIUM) 40 MG capsule Take 40 mg by mouth daily before  breakfast.      . famotidine (PEPCID) 40 MG tablet Take 40 mg by mouth daily.      . fenofibrate (TRICOR) 145 MG tablet Take 145 mg by mouth daily.      . ferrous sulfate 325 (65 FE) MG EC tablet Take 325 mg by mouth daily.      . fexofenadine (ALLEGRA) 180 MG tablet Take 180 mg by mouth daily.      . fish oil-omega-3 fatty acids 1000 MG capsule Take 2 g by mouth daily.      . fluticasone (CUTIVATE) 0.05 % cream Apply topically 2 (two) times daily.      Marland Kitchen glimepiride (AMARYL) 2 MG tablet Take 2 mg by mouth daily before breakfast.      . levothyroxine (SYNTHROID) 75 MCG tablet Take 75 mcg by mouth daily.      . nabumetone (RELAFEN) 500 MG tablet Take 500 mg by mouth 2 (two) times daily.      Marland Kitchen PARoxetine (PAXIL) 40 MG tablet Take 40 mg by mouth every morning.      . promethazine (PHENERGAN) 25 MG tablet Take 25 mg by mouth every 6 (six) hours as needed.      Marland Kitchen  solifenacin (VESICARE) 5 MG tablet Take 10 mg by mouth daily.      Marland Kitchen triamterene-hydrochlorothiazide (DYAZIDE) 37.5-25 MG per capsule Take 1 capsule by mouth every morning.      . valsartan (DIOVAN) 160 MG tablet Take 160 mg by mouth daily.      . megestrol (MEGACE) 20 MG tablet Take 20 mg by mouth 2 (two) times daily.        Past Medical History  Diagnosis Date  . Hypertension   . Hypothyroid   . Breast cancer   . Diabetes mellitus   . High cholesterol    Past Surgical History  Procedure Date  . Lense implants   . Kidney stent   . Appendectomy    History   Social History  . Marital Status: Widowed    Spouse Name: N/A    Number of Children: N/A  . Years of Education: N/A   Occupational History  . Not on file.   Social History Main Topics  . Smoking status: Never Smoker   . Smokeless tobacco: Not on file  . Alcohol Use: Not on file  . Drug Use: Not on file  . Sexually Active: Not on file   Other Topics Concern  . Not on file   Social History Narrative  . No narrative on file   Family Status  Relation  Status Death Age  . Mother Deceased     stomach  . Father Deceased     diabetes, CAD  . Sister Deceased     One deceased breast cancer   Allergies  Allergen Reactions  . Ampicillin   . Benadryl (Altaryl)   . Sulfa Antibiotics        Objective:   Physical Exam  Filed Vitals:   10/26/11 1540  Height: 5\' 5"  (1.651 m)  Weight: 145 lb 14.4 oz (66.18 kg)   Alert and oriented. Skin warm and dry. Oral mucosa is moist.   . Sclera anicteric, conjunctivae is pink. Thyroid not enlarged. No cervical lymphadenopathy. Lungs clear. Heart regular rate and rhythm.  Abdomen is soft. Bowel sounds are positive. No hepatomegaly. No abdominal masses felt. No tenderness.  No edema to lower extremities. Patient is alert and oriented.       Assessment:    Suspect her nausea may be from the pain in her back.  Patient admits that her nausea occur with back pain. Her last EGD revealed no evidence of PUD in June of last year.     Plan:   Break 1/2 of the Phenergan for nausea. Take your pain medication as needed. PR in 2 weeks.

## 2012-03-15 ENCOUNTER — Encounter: Payer: Medicare Other | Admitting: Internal Medicine

## 2012-03-15 DIAGNOSIS — C50919 Malignant neoplasm of unspecified site of unspecified female breast: Secondary | ICD-10-CM

## 2012-07-03 DIAGNOSIS — I509 Heart failure, unspecified: Secondary | ICD-10-CM

## 2012-07-03 HISTORY — DX: Heart failure, unspecified: I50.9

## 2012-08-09 ENCOUNTER — Other Ambulatory Visit (HOSPITAL_COMMUNITY): Payer: Self-pay | Admitting: Cardiovascular Disease

## 2012-08-09 DIAGNOSIS — R0602 Shortness of breath: Secondary | ICD-10-CM

## 2012-08-13 ENCOUNTER — Ambulatory Visit (HOSPITAL_COMMUNITY)
Admission: RE | Admit: 2012-08-13 | Discharge: 2012-08-13 | Disposition: A | Discharge status: Home / Self Care | Payer: Medicare Other | Source: Ambulatory Visit | Attending: Cardiovascular Disease | Admitting: Cardiovascular Disease

## 2012-08-13 DIAGNOSIS — R0602 Shortness of breath: Secondary | ICD-10-CM | POA: Insufficient documentation

## 2012-08-14 NOTE — Procedures (Signed)
NAME:  [name] [name], [name] [name]                  ACCOUNT NO.:  [id number]  MEDICAL RECORD NO.:  [id number]  LOCATION:  RESP                          FACILITY:  [facility]  PHYSICIAN:  Natalynn Pedone L. Juanetta Gosling, M.D.DATE OF BIRTH:  04/13/1927  DATE OF PROCEDURE: DATE OF DISCHARGE:                           PULMONARY FUNCTION TEST   REASON FOR PULMONARY FUNCTION TESTING:  Shortness of breath.  1. Spirometry shows no ventilatory defect and no evidence of airflow     obstruction. 2. Lung volumes are minimally reduced, this may indicate some mild     restrictive pulmonary disease and clinical correlation is     suggested. 3. DLCO is moderately reduced, but corrects with ventilation with     placement of __________ to a mild diffusion deficit. 4. Airway resistance is elevated, but suggests some evidence of     airflow obstruction.     Nerea Bordenave L. Juanetta Gosling, M.D.     ELH/MEDQ  D:  08/13/2012  T:  08/13/2012  Job:  [id number]  cc:   Gerlene Burdock A. Alanda Amass, M.D. Fax: [phone]

## 2012-09-16 ENCOUNTER — Other Ambulatory Visit (HOSPITAL_COMMUNITY): Payer: Self-pay | Admitting: Pulmonary Disease

## 2012-09-16 DIAGNOSIS — R079 Chest pain, unspecified: Secondary | ICD-10-CM

## 2012-09-16 DIAGNOSIS — R0602 Shortness of breath: Secondary | ICD-10-CM

## 2012-09-17 ENCOUNTER — Ambulatory Visit (HOSPITAL_COMMUNITY)
Admission: RE | Admit: 2012-09-17 | Discharge: 2012-09-17 | Disposition: A | Discharge status: Home / Self Care | Payer: Medicare Other | Source: Ambulatory Visit | Attending: Pulmonary Disease | Admitting: Pulmonary Disease

## 2012-09-17 ENCOUNTER — Encounter (HOSPITAL_COMMUNITY): Payer: Self-pay

## 2012-09-17 DIAGNOSIS — R079 Chest pain, unspecified: Secondary | ICD-10-CM | POA: Insufficient documentation

## 2012-09-17 DIAGNOSIS — E119 Type 2 diabetes mellitus without complications: Secondary | ICD-10-CM | POA: Insufficient documentation

## 2012-09-17 DIAGNOSIS — R0602 Shortness of breath: Secondary | ICD-10-CM | POA: Insufficient documentation

## 2012-09-17 MED ORDER — IOHEXOL 300 MG/ML  SOLN: 80.0000 mL | Freq: Once | INTRAMUSCULAR | Status: AC | PRN

## 2012-09-17 MED ORDER — IOHEXOL 350 MG/ML SOLN
80.0000 mL | Freq: Once | INTRAVENOUS | Status: AC | PRN
  Administered 2012-09-17: 80 mL via INTRAVENOUS

## 2012-10-02 ENCOUNTER — Ambulatory Visit (HOSPITAL_COMMUNITY): Payer: Medicare Other | Attending: Pulmonary Disease

## 2012-10-02 DIAGNOSIS — R0989 Other specified symptoms and signs involving the circulatory and respiratory systems: Secondary | ICD-10-CM | POA: Insufficient documentation

## 2012-10-02 DIAGNOSIS — R0600 Dyspnea, unspecified: Secondary | ICD-10-CM

## 2012-10-02 DIAGNOSIS — R0609 Other forms of dyspnea: Secondary | ICD-10-CM | POA: Insufficient documentation

## 2013-02-10 ENCOUNTER — Other Ambulatory Visit: Payer: Self-pay | Admitting: Cardiovascular Disease

## 2013-02-11 LAB — COMPREHENSIVE METABOLIC PANEL
CO2: 25 mEq/L (ref 19–32)
Creat: 1.19 mg/dL — ABNORMAL HIGH (ref 0.50–1.10)
Glucose, Bld: 81 mg/dL (ref 70–99)
Sodium: 138 mEq/L (ref 135–145)
Total Bilirubin: 0.8 mg/dL (ref 0.3–1.2)
Total Protein: 7.1 g/dL (ref 6.0–8.3)

## 2013-02-11 LAB — CBC WITH DIFFERENTIAL/PLATELET
Eosinophils Absolute: 0.2 10*3/uL (ref 0.0–0.7)
Eosinophils Relative: 3 % (ref 0–5)
HCT: 37.5 % (ref 36.0–46.0)
Hemoglobin: 12.4 g/dL (ref 12.0–15.0)
Lymphs Abs: 1.9 10*3/uL (ref 0.7–4.0)
MCH: 30.3 pg (ref 26.0–34.0)
MCV: 91.7 fL (ref 78.0–100.0)
Monocytes Relative: 11 % (ref 3–12)
RBC: 4.09 MIL/uL (ref 3.87–5.11)

## 2013-02-12 ENCOUNTER — Encounter: Payer: Self-pay | Admitting: Cardiovascular Disease

## 2013-02-20 ENCOUNTER — Telehealth: Payer: Self-pay | Admitting: Cardiovascular Disease

## 2013-02-20 NOTE — Telephone Encounter (Signed)
Returned call and spoke w/ pharmacist.  Stated he has faxed refill request several times w/o response.  Stated pt needs refill on Toprol XL 25 mg daily.  Informed RN will notify Dr. Alanda Amass as last OV note states pt does not tolerate beta blockers well.  Will send electronically if approved.  JC, LPN notified and OV note left on cart for Dr. Alanda Amass to review.

## 2013-02-20 NOTE — Telephone Encounter (Signed)
Beth was calling regarding an efax that was sent for Metoprolol. Please check on this.

## 2013-02-20 NOTE — Telephone Encounter (Signed)
Message forwarded to J.C. Wildman, LPN.  

## 2013-02-26 ENCOUNTER — Telehealth: Payer: Self-pay | Admitting: Cardiovascular Disease

## 2013-02-26 NOTE — Telephone Encounter (Signed)
Message forwarded to J.C. Wildman, LPN.  

## 2013-02-26 NOTE — Telephone Encounter (Signed)
Need to clarify prescriptions that was sent in today for Diovan 160 mg and on 8-11- he sent prescription in for Diovan 320/25 mg-Which one does he wants her to take?

## 2013-02-27 NOTE — Telephone Encounter (Signed)
Talked to pharmacist , diovan  hct 320/25 and toprol xl 25 mg are the doses verified by dr. Alanda Amass

## 2013-02-27 NOTE — Telephone Encounter (Signed)
Still needs to know what medicine she needs, the regular Diovan or the combo.

## 2013-03-04 NOTE — Telephone Encounter (Signed)
toprol xl filled 25mg  one po q day with 6 refills

## 2013-03-24 DIAGNOSIS — C50919 Malignant neoplasm of unspecified site of unspecified female breast: Secondary | ICD-10-CM

## 2013-04-29 ENCOUNTER — Telehealth (HOSPITAL_COMMUNITY): Payer: Self-pay | Admitting: *Deleted

## 2013-06-09 ENCOUNTER — Encounter: Payer: Self-pay | Admitting: Cardiovascular Disease

## 2013-06-09 ENCOUNTER — Ambulatory Visit (INDEPENDENT_AMBULATORY_CARE_PROVIDER_SITE_OTHER): Payer: Medicare Other | Admitting: Cardiovascular Disease

## 2013-06-09 VITALS — BP 156/98 | HR 61 | Ht 64.0 in | Wt 166.5 lb

## 2013-06-09 DIAGNOSIS — F09 Unspecified mental disorder due to known physiological condition: Secondary | ICD-10-CM | POA: Insufficient documentation

## 2013-06-09 DIAGNOSIS — I701 Atherosclerosis of renal artery: Secondary | ICD-10-CM

## 2013-06-09 DIAGNOSIS — Z23 Encounter for immunization: Secondary | ICD-10-CM

## 2013-06-09 DIAGNOSIS — I10 Essential (primary) hypertension: Secondary | ICD-10-CM

## 2013-06-09 DIAGNOSIS — E7800 Pure hypercholesterolemia, unspecified: Secondary | ICD-10-CM

## 2013-06-09 DIAGNOSIS — F079 Unspecified personality and behavioral disorder due to known physiological condition: Secondary | ICD-10-CM

## 2013-06-09 MED ORDER — AMLODIPINE BESYLATE 2.5 MG PO TABS: 2.5000 mg | ORAL_TABLET | Freq: Every day | ORAL | Status: AC

## 2013-06-09 NOTE — Assessment & Plan Note (Signed)
On statin therapy followed by her PCP 

## 2013-06-09 NOTE — Assessment & Plan Note (Signed)
Status post stenting of her right renal artery by Dr. Susa Griffins 11/15/99. He was getting followup renal Doppler studies on an annual basis last performed in September 2013 revealing a widely patent stent.

## 2013-06-09 NOTE — Progress Notes (Signed)
06/09/2013 [name] [name]   08/06/1926  [id number]  Primary Physician Toma Deiters, MD Primary Cardiologist: Runell Gess MD Roseanne Reno   HPI: Ms Janvrin is a 79 year old moderately overweight.widowed Caucasian female mother of 5, grandmother to 39 grandchildren formally a patient of Dr. Susa Griffins. I'm assuming her ongoing cardiovascular care.. She has a history of neurocardiogenic syncope in the past. She has mild aortic stenosis. She had a heart catheterization performed by Dr. Ayesha Rumpf 03/30/08 revealing mild CAD with normal LV function. Her last  Myoview stress test performed 03/23/11 was no ischemic. She denies chest pain or shortness of breath. Problems include history of renal artery stenosis status post stenting by Dr. Cleatis Polka 2001, hypertension and hyperlipidemia.    Current Outpatient Prescriptions  Medication Sig Dispense Refill  . albuterol (PROVENTIL) (2.5 MG/3ML) 0.083% nebulizer solution Take 2.5 mg by nebulization every 6 (six) hours as needed for wheezing or shortness of breath.      . ALPRAZolam (XANAX) 0.5 MG tablet Take 0.5 mg by mouth at bedtime as needed.      Marland Kitchen aspirin 81 MG tablet Take 81 mg by mouth daily.      Marland Kitchen atorvastatin (LIPITOR) 40 MG tablet Take 40 mg by mouth daily.      . busPIRone (BUSPAR) 15 MG tablet Take 15 mg by mouth at bedtime.      . calcitonin, salmon, (MIACALCIN/FORTICAL) 200 UNIT/ACT nasal spray Place 1 spray into the nose daily.      . Calcium Citrate-Vitamin D (CALCIUM CITRATE + D3 MAXIMUM) 315-250 MG-UNIT TABS Take 1 tablet by mouth daily.      Marland Kitchen donepezil (ARICEPT) 10 MG tablet Take 10 mg by mouth at bedtime as needed.      . ergocalciferol (VITAMIN D2) 50000 UNITS capsule Take 50,000 Units by mouth once a week.      . esomeprazole (NEXIUM) 40 MG capsule Take 40 mg by mouth daily before breakfast.      . famotidine (PEPCID) 40 MG tablet Take 40 mg by mouth daily.      . ferrous sulfate 325 (65 FE) MG EC tablet Take 325  mg by mouth daily.      . fexofenadine (ALLEGRA) 180 MG tablet Take 180 mg by mouth daily.      . fish oil-omega-3 fatty acids 1000 MG capsule Take 2 g by mouth daily.      Marland Kitchen HYDROcodone-acetaminophen (NORCO) 7.5-325 MG per tablet Take 1 tablet by mouth as needed.      Marland Kitchen levothyroxine (SYNTHROID, LEVOTHROID) 100 MCG tablet Take 100 mcg by mouth daily before breakfast.      . megestrol (MEGACE) 20 MG tablet Take 20 mg by mouth 2 (two) times daily.      . meloxicam (MOBIC) 15 MG tablet Take 15 mg by mouth daily.      . metoprolol succinate (TOPROL-XL) 25 MG 24 hr tablet Take 25 mg by mouth 2 (two) times daily.      Marland Kitchen NAMENDA 10 MG tablet Take 10 mg by mouth daily.      Marland Kitchen PARoxetine (PAXIL) 40 MG tablet Take 40 mg by mouth every morning.      . solifenacin (VESICARE) 5 MG tablet Take 10 mg by mouth daily.      Marland Kitchen triamcinolone ointment (KENALOG) 0.1 % Apply 1 application topically daily as needed.      . valsartan-hydrochlorothiazide (DIOVAN-HCT) 320-25 MG per tablet Take 1 tablet by mouth daily.      Marland Kitchen  amLODipine (NORVASC) 2.5 MG tablet Take 1 tablet (2.5 mg total) by mouth daily.  180 tablet  3   No current facility-administered medications for this visit.    Allergies  Allergen Reactions  . Ampicillin   . Benadryl [Diphenhydramine Hcl]   . Sulfa Antibiotics     History   Social History  . Marital Status: Widowed    Spouse Name: N/A    Number of Children: N/A  . Years of Education: N/A   Occupational History  . Not on file.   Social History Main Topics  . Smoking status: Never Smoker   . Smokeless tobacco: Not on file  . Alcohol Use: Not on file  . Drug Use: Not on file  . Sexual Activity: Not on file   Other Topics Concern  . Not on file   Social History Narrative  . No narrative on file     Review of Systems: General: negative for chills, fever, night sweats or weight changes.  Cardiovascular: negative for chest pain, dyspnea on exertion, edema, orthopnea,  palpitations, paroxysmal nocturnal dyspnea or shortness of breath Dermatological: negative for rash Respiratory: negative for cough or wheezing Urologic: negative for hematuria Abdominal: negative for nausea, vomiting, diarrhea, bright red blood per rectum, melena, or hematemesis Neurologic: negative for visual changes, syncope, or dizziness All other systems reviewed and are otherwise negative except as noted above.    Blood pressure 156/98, pulse 61, height 5\' 4"  (1.626 m), weight 166 lb 8 oz (75.524 kg).  General appearance: alert and no distress Neck: no adenopathy, no carotid bruit, no JVD, supple, symmetrical, trachea midline and thyroid not enlarged, symmetric, no tenderness/mass/nodules Lungs: clear to auscultation bilaterally Heart: regular rate and rhythm, S1, S2 normal, no murmur, click, rub or gallop Abdomen: soft, non-tender; bowel sounds normal; no masses,  no organomegaly Extremities: extremities normal, atraumatic, no cyanosis or edema Pulses: 2+ and symmetric  EKG normal sinus rhythm at 61 with sinus arrhythmia  ASSESSMENT AND PLAN:   Hypertension History of renal artery stenting back in 2001 by Dr. Susa Griffins. We've been following her renal Doppler study since her annual basis last checked September 2013. She is on multiple antihypertensive medications. Her blood pressure stays elevated. I'm going to recheck a renal Doppler study.  Renal artery stenosis Status post stenting of her right renal artery by Dr. Susa Griffins 11/15/99. He was getting followup renal Doppler studies on an annual basis last performed in September 2013 revealing a widely patent stent.  High cholesterol On statin therapy followed by her PCP      Runell Gess MD [facility], [facility] 06/09/2013 12:12 PM

## 2013-06-09 NOTE — Patient Instructions (Addendum)
Your physician recommends that you schedule a follow-up appointment in: 1 Month with kristen for Blood pressure check  Your physician recommends that you schedule a follow-up appointment in: 3 months with PA  Your physician recommends that you schedule a follow-up appointment in: 6 months with Dr Allyson Sabal  Your physician has requested that you have a renal artery duplex. During this test, an ultrasound is used to evaluate blood flow to the kidneys. Allow one hour for this exam. Do not eat after midnight the day before and avoid carbonated beverages. Take your medications as you usually do.

## 2013-06-09 NOTE — Assessment & Plan Note (Signed)
History of renal artery stenting back in 2001 by Dr. Susa Griffins. We've been following [name] [name] renal Doppler study since [name] [name] annual basis last checked September 2013. She is on multiple antihypertensive medications. [name] [name] blood pressure stays elevated. I'm going to recheck a renal Doppler study.

## 2013-06-10 ENCOUNTER — Encounter: Payer: Self-pay | Admitting: Cardiovascular Disease

## 2013-07-02 ENCOUNTER — Telehealth: Payer: Self-pay | Admitting: Cardiovascular Disease

## 2013-07-02 NOTE — Telephone Encounter (Signed)
Message forwarded to K. Vogel, RN to discuss w/ Dr. Berry.   

## 2013-07-02 NOTE — Telephone Encounter (Signed)
That's fine to cancel the appt with [facility] for blood pressure check since blood pressure reading at primary doctor was good.  I would advise patient to continue to keep a check on blood pressure and keep all other appts.  I attempted to call patient, but voice mail box not set up.

## 2013-07-02 NOTE — Telephone Encounter (Signed)
Went to her primary doctor yesterday. Her BP was down 118/72,she is going to cancel her BP check appt.

## 2013-07-07 NOTE — Telephone Encounter (Signed)
Lm with information

## 2013-07-14 ENCOUNTER — Encounter: Payer: Medicare Other | Admitting: Pharmacist Clinician (PhC)/ Clinical Pharmacy Specialist

## 2013-07-14 ENCOUNTER — Telehealth (HOSPITAL_COMMUNITY): Payer: Self-pay | Admitting: *Deleted

## 2013-07-15 ENCOUNTER — Inpatient Hospital Stay (HOSPITAL_COMMUNITY): Admission: RE | Admit: 2013-07-15 | Payer: Medicare Other | Source: Ambulatory Visit

## 2013-08-04 ENCOUNTER — Telehealth (HOSPITAL_COMMUNITY): Payer: Self-pay | Admitting: *Deleted

## 2013-08-05 ENCOUNTER — Encounter (INDEPENDENT_AMBULATORY_CARE_PROVIDER_SITE_OTHER): Payer: Self-pay | Admitting: *Deleted

## 2013-08-20 ENCOUNTER — Telehealth (HOSPITAL_COMMUNITY): Payer: Self-pay | Admitting: *Deleted

## 2013-10-30 ENCOUNTER — Other Ambulatory Visit (HOSPITAL_COMMUNITY): Payer: Self-pay | Admitting: Cardiovascular Disease

## 2013-12-09 ENCOUNTER — Encounter (HOSPITAL_COMMUNITY): Payer: Medicare Other

## 2013-12-09 ENCOUNTER — Ambulatory Visit: Payer: Medicare Other | Admitting: Cardiovascular Disease

## 2013-12-29 ENCOUNTER — Telehealth: Payer: Self-pay | Admitting: Cardiovascular Disease

## 2013-12-29 NOTE — Telephone Encounter (Signed)
Okay to have OT

## 2013-12-29 NOTE — Telephone Encounter (Signed)
Forwarded to Dr. Karlyne Greenspan to approve.

## 2013-12-29 NOTE — Telephone Encounter (Signed)
Need verbal authorization to treat pt for Occupational Thearpy for 2 times a weeks for weeks please.

## 2013-12-30 NOTE — Telephone Encounter (Signed)
Notified KAT of verbal order to continue with occup. Therapy. Verbalized understanding.

## 2014-06-05 ENCOUNTER — Ambulatory Visit: Payer: Medicare Other | Admitting: Cardiovascular Disease

## 2015-01-13 ENCOUNTER — Encounter: Payer: Self-pay | Admitting: *Deleted

## 2015-01-14 ENCOUNTER — Encounter: Payer: Self-pay | Admitting: *Deleted

## 2015-02-11 ENCOUNTER — Encounter: Payer: Self-pay | Admitting: Cardiovascular Disease

## 2015-02-12 ENCOUNTER — Encounter: Payer: Self-pay | Admitting: Cardiovascular Disease

## 2015-03-16 ENCOUNTER — Other Ambulatory Visit (HOSPITAL_COMMUNITY)
Admission: RE | Admit: 2015-03-16 | Discharge: 2015-03-16 | Disposition: A | Discharge status: Home / Self Care | Payer: Medicare Other | Source: Other Acute Inpatient Hospital | Attending: Internal Medicine | Admitting: Internal Medicine

## 2015-03-16 DIAGNOSIS — N390 Urinary tract infection, site not specified: Secondary | ICD-10-CM | POA: Insufficient documentation

## 2015-03-16 LAB — URINALYSIS, ROUTINE W REFLEX MICROSCOPIC
BILIRUBIN URINE: NEGATIVE
Glucose, UA: NEGATIVE mg/dL
Ketones, ur: NEGATIVE mg/dL
Leukocytes, UA: NEGATIVE
NITRITE: NEGATIVE
PH: 7 (ref 5.0–8.0)
Protein, ur: NEGATIVE mg/dL
UROBILINOGEN UA: 0.2 mg/dL (ref 0.0–1.0)

## 2015-03-16 LAB — URINE MICROSCOPIC-ADD ON

## 2015-03-18 LAB — URINE CULTURE

## 2015-11-01 DEATH — deceased
